# Patient Record
Sex: Female | Born: 2000 | Race: White | Hispanic: No | Marital: Single | Smoking: Never smoker
Health system: Southern US, Community
[De-identification: ages and names within clinical notes are randomized; demographics above are authoritative.]

## PROBLEM LIST (undated history)

## (undated) DIAGNOSIS — G90A Postural orthostatic tachycardia syndrome (POTS): Secondary | ICD-10-CM

## (undated) HISTORY — DX: Postural orthostatic tachycardia syndrome (POTS): G90.A

---
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
## 2022-01-07 ENCOUNTER — Inpatient Hospital Stay: Payer: Commercial Managed Care - PPO

## 2022-01-07 ENCOUNTER — Encounter: Payer: Self-pay | Admitting: Internal Medicine

## 2022-01-07 ENCOUNTER — Ambulatory Visit: Payer: Commercial Managed Care - PPO | Admitting: Internal Medicine

## 2022-01-07 ENCOUNTER — Ambulatory Visit: Payer: Commercial Managed Care - PPO | Admitting: Cardiology

## 2022-01-07 VITALS — Temp 98.3°F | Resp 16 | Ht 64.0 in | Wt 135.0 lb

## 2022-01-07 DIAGNOSIS — G90A Postural orthostatic tachycardia syndrome (POTS): Secondary | ICD-10-CM | POA: Insufficient documentation

## 2022-01-07 DIAGNOSIS — I493 Ventricular premature depolarization: Secondary | ICD-10-CM

## 2022-01-07 DIAGNOSIS — R002 Palpitations: Secondary | ICD-10-CM

## 2022-01-07 NOTE — Progress Notes (Signed)
Primary Physician/Referring:  Imagene Riches, NP  Patient ID: Cathy Lee, female    DOB: 2001/02/07, 21 y.o.   MRN: [id number]  Chief Complaint  Patient presents with  . Referral  . 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 female with past medical history significant for orthostatic hypotension, palpitations, and frequent PVCs who is here to establish care with cardiology.  Her primary care provider believes patient may have pots disease which is consistent with her symptoms.  Patient does have palpitations and she used to go into bigeminy when she was very symptomatic.  She gets lightheaded when changing position and feels like she is going to pass out.  She does not smoke or drink alcohol.  Denies recreational drug use.  She has not had any hormone or vitamin levels checked that could possibly be attributing to this.  She denies any cardiac history in herself or her family prior to this.  Past Medical History:  Diagnosis Date  . Postural orthostatic tachycardia syndrome    History reviewed. No pertinent surgical history. History reviewed. No pertinent family history.  Social History   Tobacco Use  . Smoking status: Never  . Smokeless tobacco: Never  Substance Use Topics  . Alcohol use: Yes    Comment: socially   Marital Status: Single  ROS  Review of Systems  Cardiovascular:  Positive for irregular heartbeat, near-syncope, palpitations and syncope.  All other systems reviewed and are negative. Objective  Temperature 98.3 F (36.8 C), temperature source Temporal, resp. rate 16, height _0  (1.626 m), weight 135 lb (61.2 kg), SpO2 100 %. Body mass index is 23.17 kg/m.     01/07/2022   11:23 AM 07/11/2011   11:27 AM  Vitals with BMI  Height _1    Weight 135 lbs   BMI 57.84   Systolic  696  Diastolic  77  Pulse  80     Physical Exam Vitals reviewed.  Constitutional:      Appearance:  Normal appearance. She is normal weight.  HENT:     Head: Normocephalic and atraumatic.  Neck:     Vascular: No carotid bruit.  Cardiovascular:     Rate and Rhythm: Normal rate and regular rhythm.     Pulses: Normal pulses.     Heart sounds: Normal heart sounds.  Pulmonary:     Effort: Pulmonary effort is normal.     Breath sounds: Normal breath sounds.  Abdominal:     General: Abdomen is flat. Bowel sounds are normal.     Palpations: Abdomen is soft.  Musculoskeletal:     Right lower leg: No edema.     Left lower leg: No edema.  Skin:    General: Skin is warm and dry.  Neurological:     Mental Status: She is alert.   Medications and allergies  No Known Allergies   Medication list after today's encounter   Current Outpatient Medications:  .  Cyanocobalamin (B-12) 3000 MCG CAPS, Take 0.5 capsules by mouth daily., Disp: , Rfl:  .  ibuprofen (ADVIL,MOTRIN) 400 MG tablet, Take 400 mg by mouth every 6 (six) hours as needed., Disp: , Rfl:  .  magnesium oxide (MAG-OX) 400 MG tablet, Take by mouth., Disp: , Rfl:   Laboratory examination:   No results found for: "NA", "K", "CO2", "GLUCOSE", "BUN", "CREATININE", "CALCIUM", "EGFR", "GFRNONAA"  No data to display             No data to display          Lipid Panel No results for input(s): "CHOL", "TRIG", "Pimaco Two", "VLDL", "HDL", "CHOLHDL", "LDLDIRECT" in the last 8760 hours.  HEMOGLOBIN A1C No results found for: "HGBA1C", "MPG" TSH No results for input(s): "TSH" in the last 8760 hours.  External labs:     Radiology:    Cardiac Studies:   No results found for this or any previous visit from the past 1095 days.     No results found for this or any previous visit from the past 1095 days.     EKG:   01/07/22 EKG normal sinus rhythm  Assessment     ICD-10-CM   1. Postural orthostatic tachycardia syndrome  G90.A EKG 12-Lead    [facility] ECHOCARDIOGRAM COMPLETE    LONG TERM MONITOR (3-14 DAYS)    High  sensitivity CRP    Lipid Panel With LDL/HDL Ratio    TSH+T4F+T3Free    B12 and Folate Panel    Vitamin D (25 hydroxy)    TILT TABLE STUDY    2. Palpitations  R00.2     3. 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 High sensitivity CRP  . Lipid Panel With LDL/HDL Ratio  . TSH+T4F+T3Free  . B12 and Folate Panel  . Vitamin D (25 hydroxy)  . LONG TERM MONITOR (3-14 DAYS)    Standing Status:   Future    Number of Occurrences:   1    Standing Expiration Date:   01/08/2023    Order Specific Question:   Where should this test be performed?    Answer:   [facility]    Order Specific Question:   Does the patient have an implanted cardiac device?    Answer:   No    Order Specific Question:   Prescribed days of wear    Answer:   61    Order Specific Question:   Type of enrollment    Answer:   Clinic Enrollment    Order Specific Question:   Release to patient    Answer:   Immediate  . EKG 12-Lead  . PCV ECHOCARDIOGRAM COMPLETE    Standing Status:   Future    Standing Expiration Date:   01/08/2023  . TILT TABLE STUDY    Rule out POTTS    No orders of the defined types were placed in this encounter.   There are no discontinued medications.   Recommendations:   Cathy Lee is a 21 y.o.  F with potential POTS disease  Continue current medications. Check labs, including TSH/vitD/b12 Will arrange tilt table test. Schedule imaging tests in office - echo and event monitor. Follow-up in 1-2 months or sooner if needed.     Cathy Flock, DO  01/07/2022, 2:50 PM Office: [phone] Pager: [phone]

## 2022-01-07 NOTE — Patient Instructions (Signed)
Continue current medications. Check labs, including TSH/vitD/b12 Will arrange tilt table test. Schedule imaging tests in office - echo and event monitor. Follow-up in 1-2 months or sooner if needed.

## 2022-01-08 LAB — LIPID PANEL WITH LDL/HDL RATIO
Cholesterol, Total: 180 mg/dL (ref 100–199)
HDL: 102 mg/dL (ref >39)
LDL Chol Calc (NIH): 68 mg/dL (ref 0–99)
LDL/HDL Ratio: 0.7 ratio (ref 0.0–3.2)
Triglycerides: 51 mg/dL (ref 0–149)
VLDL Cholesterol Cal: 10 mg/dL (ref 5–40)

## 2022-01-08 LAB — TSH+T4F+T3FREE
Free T4: 1.16 ng/dL (ref 0.82–1.77)
T3, Free: 2.4 pg/mL (ref 2.0–4.4)
TSH: 0.796 u[IU]/mL (ref 0.450–4.500)

## 2022-01-08 LAB — HIGH SENSITIVITY CRP: CRP, High Sensitivity: <0.15 mg/L (ref 0.00–3.00)

## 2022-01-08 LAB — B12 AND FOLATE PANEL
Folate: 8.8 ng/mL (ref >3.0)
Vitamin B-12: 424 pg/mL (ref 232–1245)

## 2022-01-08 LAB — VITAMIN D 25 HYDROXY (VIT D DEFICIENCY, FRACTURES): Vit D, 25-Hydroxy: 53.2 ng/mL (ref 30.0–100.0)

## 2022-01-24 ENCOUNTER — Ambulatory Visit: Payer: Commercial Managed Care - PPO | Admitting: Psychiatry

## 2022-02-14 ENCOUNTER — Ambulatory Visit: Payer: Commercial Managed Care - PPO

## 2022-02-14 DIAGNOSIS — G90A Postural orthostatic tachycardia syndrome (POTS): Secondary | ICD-10-CM

## 2022-02-15 NOTE — Progress Notes (Unsigned)
[facility]  PATIENT: Cathy Lee DOB: 01-10-2001  REFERRING CLINICIAN: Rhea Bleacher, NP HISTORY FROM: self REASON FOR VISIT: headaches, dizziness   HISTORICAL  CHIEF COMPLAINT:  Chief Complaint  Patient presents with   New Patient (Initial Visit)    Pr reports feeling good. She states she started noticing it about 34 months ago. She reports she gets the shakes, blotchy vision, dizziness and sweating. Room 1 alone    HISTORY OF PRESENT ILLNESS:  The patient presents for evaluation of lightheadedness which began 3-4 months ago. Episodes are triggered by standing up too fast, working out, and showers. She will feel light headed and will develop palpitations. Then will develop tremors, diaphoresis, and black spots in her vision. She will occasionally see flashing speckles in her vision as well. Episodes are not associated with a headache. Symptoms last 30 seconds to 2 minutes at a time. She is not confused afterwards. They typically occur 2-4 times per week. She had similar symptoms a couple of years ago and lost consciousness at that time, but has not lost consciousness with the most recent episodes.   She is following with Cardiology and currently undergoing workup for POTS. She is working on scheduling a tilt table study.  Was found to have vitamin B12 deficiency by her PCP in July and was started on B12 supplements.  Had an MRI brain done in 2016 for migraines which was normal.  OTHER MEDICAL CONDITIONS: anxiety, migraines   REVIEW OF SYSTEMS: Full 14 system review of systems performed and negative with exception of: lightheadedness  ALLERGIES: No Known Allergies  HOME MEDICATIONS: Outpatient Medications Prior to Visit  Medication Sig Dispense Refill   Cyanocobalamin (B-12) 3000 MCG CAPS Take 0.5 capsules by mouth daily.     ibuprofen (ADVIL,MOTRIN) 400 MG tablet Take 400 mg by mouth every 6 (six) hours as needed.     magnesium oxide (MAG-OX) 400  MG tablet Take by mouth.     No facility-administered medications prior to visit.    PAST MEDICAL HISTORY: Past Medical History:  Diagnosis Date   Postural orthostatic tachycardia syndrome     PAST SURGICAL HISTORY: No past surgical history on file.  FAMILY HISTORY: No family history on file.  SOCIAL HISTORY: Social History   Socioeconomic History   Marital status: Single    Spouse name: Not on file   Number of children: Not on file   Years of education: Not on file   Highest education level: Not on file  Occupational History   Not on file  Tobacco Use   Smoking status: Never   Smokeless tobacco: Never  Vaping Use   Vaping Use: Never used  Substance and Sexual Activity   Alcohol use: Yes    Comment: socially   Drug use: Never   Sexual activity: Never    Birth control/protection: Abstinence  Other Topics Concern   Not on file  Social History Narrative   Not on file   Social Determinants of Health   Financial Resource Strain: Not on file  Food Insecurity: Not on file  Transportation Needs: Not on file  Physical Activity: Not on file  Stress: Not on file  Social Connections: Not on file  Intimate Partner Violence: Not on file     PHYSICAL EXAM  GENERAL EXAM/CONSTITUTIONAL: Vitals:  Vitals:   02/16/22 1245  BP: 119/74  Pulse: 61  Weight: 139 lb 8 oz (63.3 kg)  Height: 5\' 4"  (1.626 m)   Body mass  index is 23.95 kg/m. Wt Readings from Last 3 Encounters:  02/16/22 139 lb 8 oz (63.3 kg)  01/07/22 135 lb (61.2 kg)    NEUROLOGIC: MENTAL STATUS:  awake, alert, oriented to person, place and time recent and remote memory intact normal attention and concentration   CRANIAL NERVE:  2nd, 3rd, 4th, 6th - pupils equal and reactive to light, visual fields full to confrontation, extraocular muscles intact, no nystagmus 5th - facial sensation symmetric 7th - facial strength symmetric 8th - hearing intact 9th - palate elevates symmetrically, uvula  midline 11th - shoulder shrug symmetric 12th - tongue protrusion midline  MOTOR:  normal bulk and tone, full strength in the BUE, BLE  SENSORY:  normal and symmetric to light touch all 4 extremities  COORDINATION:  finger-nose-finger intact bilaterally  REFLEXES:  deep tendon reflexes present and symmetric  GAIT/STATION:  normal     DIAGNOSTIC DATA (LABS, IMAGING, TESTING) - I reviewed patient records, labs, notes, testing and imaging myself where available.  No results found for: "WBC", "HGB", "HCT", "MCV", "PLT" No results found for: "NA", "K", "CL", "CO2", "GLUCOSE", "BUN", "CREATININE", "CALCIUM", "PROT", "ALBUMIN", "AST", "ALT", "ALKPHOS", "BILITOT", "GFRNONAA", "GFRAA" Lab Results  Component Value Date   CHOL 180 01/07/2022   HDL 102 01/07/2022   LDLCALC 68 01/07/2022   TRIG 51 01/07/2022   No results found for: "HGBA1C" Lab Results  Component Value Date   [id number] 299 01/07/2022   Lab Results  Component Value Date   TSH 0.796 01/07/2022     ASSESSMENT AND PLAN  21 y.o. year old female with a history of migraines and anxiety who presents for evaluation of lightheadedness. She is currently undergoing evaluation for POTS by Cardiology. Episodes sound most consistent with presyncope. However given history of loss of consciousness and occasional lights in her vision associated with episodes, will order EEG to rule out underlying seizure activity.   1. Syncope, unspecified syncope type       PLAN: -EEG -Lifestyle measures for management of orthostatic intolerance provided  -Return after testing  Orders Placed This Encounter  Procedures   EEG adult     Genia Harold, MD 02/16/22 1:11 PM  I spent an average of 21 minutes chart reviewing and counseling the patient, with at least 50% of the time face to face with the patient.   [facility] [address] [place], [place] [address] [phone]

## 2022-02-16 ENCOUNTER — Ambulatory Visit (INDEPENDENT_AMBULATORY_CARE_PROVIDER_SITE_OTHER): Payer: Commercial Managed Care - PPO | Admitting: Psychiatry

## 2022-02-16 VITALS — BP 119/74 | HR 61 | Ht 64.0 in | Wt 139.5 lb

## 2022-02-16 DIAGNOSIS — R55 Syncope and collapse: Secondary | ICD-10-CM | POA: Diagnosis not present

## 2022-02-16 NOTE — Patient Instructions (Addendum)
Plan: -EEG to look for seizure activity   Guidelines for the Management of Orthostatic intolerance  1. Make all postural changes from lying to sitting or sitting to standing, slowly.  2. Drink to 2.0 -2.5 L of fluids per day.  3. Increase sodium in the diet to 3 - 5 g per day.  4. Avoid large meals which can cause low blood pressure during digestion. It is better to eat smaller meals more often than three large meals.  5. Avoid alcohol. Alcohol and cause blood to pool in the legs which may worsen low blood pressure reactions when standing.  6. Perform lower extremity exercises to improve strength of the leg muscles. This will help prevent blood from a pooling in the legs when standing and walking.  7. Raise the head of the bed by 6 to 10 inches. The entire bed must be at an angle. Raising only the head portion of the bed at waist level or using pillows will not be effective. Raising the head of the bed will reduce urine formation overnight and there will be more volume in the circulation in the morning.  8. During bad days, drink 500 cc of water quickly. This will result in an increased blood pressure within 5 minutes of drinking the water. The effect will last up to one hour and may improve orthostatic intolerance.  9. Use custom fitted elastic support stockings. These will reduce a tendency for blood to pool in the legs when standing and may improve orthostatic intolerance.  10. Use physical counter maneuvers such as leg crossing, squatting, or raising and resting the leg on a chair. These maneuvers increase blood pressure and can improve orthostatic intolerance.  11. Aerobic exercises

## 2022-02-17 ENCOUNTER — Telehealth: Payer: Self-pay | Admitting: Psychiatry

## 2022-02-17 NOTE — Telephone Encounter (Signed)
LVM and sent MyChart msg informing pt of r/s needed for 9/25 EEG- Claiborne Billings out early.

## 2022-02-21 ENCOUNTER — Other Ambulatory Visit: Payer: Commercial Managed Care - PPO | Admitting: *Deleted

## 2022-02-21 ENCOUNTER — Encounter: Payer: Self-pay | Admitting: Internal Medicine

## 2022-02-21 ENCOUNTER — Ambulatory Visit: Payer: Commercial Managed Care - PPO | Admitting: Internal Medicine

## 2022-02-21 VITALS — BP 132/84 | HR 60 | Temp 98.2°F | Resp 16 | Ht 64.0 in | Wt 135.0 lb

## 2022-02-21 DIAGNOSIS — I951 Orthostatic hypotension: Secondary | ICD-10-CM

## 2022-02-21 DIAGNOSIS — R55 Syncope and collapse: Secondary | ICD-10-CM

## 2022-02-21 DIAGNOSIS — G90A Postural orthostatic tachycardia syndrome (POTS): Secondary | ICD-10-CM

## 2022-02-21 NOTE — Progress Notes (Signed)
Primary Physician/Referring:  Rhea Bleacher, NP  Patient ID: Cathy Lee, female    DOB: 23-Jan-2001, 23 y.o.   MRN: [id number]  No chief complaint on file.  HPI:    YARIXA LIGHTCAP  is a 21 y.o. female with past medical history significant for orthostatic hypotension, palpitations, and frequent PVCs who is here for a follow-up visit.  All of her testing has come back normal. She gets lightheaded when changing position and feels like she is going to pass out.  It appears the patient suffers from orthostatic hypotension. She is agreeable to trying the POTS clinic at [facility] since her heart rate gets very elevated with position changes. Discussed wearing compression stockings, increasing salt and water intake, decreased caffeine intake and avoiding very hot temperatures.  Past Medical History:  Diagnosis Date   Postural orthostatic tachycardia syndrome    History reviewed. No pertinent surgical history. History reviewed. No pertinent family history.  Social History   Tobacco Use   Smoking status: Never   Smokeless tobacco: Never  Substance Use Topics   Alcohol use: Yes    Comment: socially   Marital Status: Single  ROS  Review of Systems  Cardiovascular:  Positive for irregular heartbeat and near-syncope. Negative for palpitations and syncope.  All other systems reviewed and are negative.  Objective  Blood pressure 132/84, pulse 60, temperature 98.2 F (36.8 C), temperature source Temporal, resp. rate 16, height _0  (1.626 m), weight 135 lb (61.2 kg), SpO2 100 %. Body mass index is 23.17 kg/m.     02/21/2022    9:27 AM 02/16/2022   12:45 PM 01/07/2022   11:23 AM  Vitals with BMI  Height _1  _2  _3   Weight 135 lbs 139 lbs 8 oz 135 lbs  BMI 23.16 65.53 74.82  Systolic 707 867   Diastolic 84 74   Pulse 60 61      Physical Exam Vitals reviewed.  Constitutional:      Appearance: Normal appearance. She is normal weight.  HENT:     Head: Normocephalic and  atraumatic.  Neck:     Vascular: No carotid bruit.  Cardiovascular:     Rate and Rhythm: Normal rate and regular rhythm.     Pulses: Normal pulses.     Heart sounds: Normal heart sounds.  Pulmonary:     Effort: Pulmonary effort is normal.     Breath sounds: Normal breath sounds.  Abdominal:     General: Abdomen is flat. Bowel sounds are normal.     Palpations: Abdomen is soft.  Musculoskeletal:     Right lower leg: No edema.     Left lower leg: No edema.  Skin:    General: Skin is warm and dry.  Neurological:     Mental Status: She is alert.   Medications and allergies  Not on File   Medication list after today's encounter   Current Outpatient Medications:    Cyanocobalamin (B-12) 3000 MCG CAPS, Take 0.5 capsules by mouth daily., Disp: , Rfl:    ibuprofen (ADVIL,MOTRIN) 400 MG tablet, Take 400 mg by mouth every 6 (six) hours as needed., Disp: , Rfl:    magnesium oxide (MAG-OX) 400 MG tablet, Take by mouth., Disp: , Rfl:   Laboratory examination:   No results found for: "NA", "K", "CO2", "GLUCOSE", "BUN", "CREATININE", "CALCIUM", "EGFR", "GFRNONAA"      No data to display             No  data to display          Lipid Panel Recent Labs    01/07/22 1258  CHOL 180  TRIG 51  LDLCALC 68  HDL 102    HEMOGLOBIN A1C No results found for: "HGBA1C", "MPG" TSH Recent Labs    01/07/22 1258  TSH 0.796    External labs:     Radiology:    Cardiac Studies:   Echocardiogram 02/14/2022:  Normal LV systolic function with visual EF 55-60%. Left ventricle cavity is normal in size. Normal left ventricular wall thickness. Normal global wall motion. Normal diastolic filling pattern, normal LAP. Calculated EF 57%.  Mild (Grade I) mitral regurgitation. Mild prolapse of the mitral valve leaflets.  Structurally normal tricuspid valve.  Mild tricuspid regurgitation. No evidence of pulmonary hypertension.  Recommend monitoring via repeat annual echo.   Patch Wear  Time:  11 days and 6 hours (2023-08-11T12:20:46-0400 to 2023-08-22T18:48:41-0400) Patient had a min HR of 37 bpm, max HR of 174 bpm, and avg HR of 71 bpm. Predominant underlying rhythm was Sinus Rhythm. Isolated SVEs were rare (<1.0%), and no SVE Couplets or SVE Triplets were present. Isolated VEs were rare (<1.0%), VE Couplets were  rare (<1.0%), and no VE Triplets were present. Ventricular Bigeminy was present.    EKG:   01/07/22 EKG normal sinus rhythm  Assessment     ICD-10-CM   1. Postural orthostatic tachycardia syndrome  G90.A Ambulatory referral to Cardiac Electrophysiology    2. Syncope and collapse  R55 Ambulatory referral to Cardiac Electrophysiology    3. Orthostatic hypotension  I95.1 Ambulatory referral to Cardiac Electrophysiology       Orders Placed This Encounter  Procedures   Ambulatory referral to Cardiac Electrophysiology    Referral Priority:   Routine    Referral Type:   Consultation    Referral Reason:   Specialty Services Required    Referred to Provider:   Rip Harbour, Shirl Harris, MD    Requested Specialty:   Cardiology    Number of Visits Requested:   1    No orders of the defined types were placed in this encounter.   There are no discontinued medications.   Recommendations:   DELCIE Lee is a 21 y.o.  F with orthostatic hypotension  Discussed wearing compression stockings, increasing salt and water intake, decreased caffeine intake and avoiding very hot temperatures.  Continue current medications. Discussed lab results - TSH/vitD/b12 Will refer to POTS clinic at [facility] Event monitor and echo discussed with patient. Repeat echo in 1 year due to MVP and mild MR. Follow-up in 12 months or sooner if needed.     [name], DO  02/21/2022, 11:29 AM Office: [phone] Pager: [phone]

## 2022-02-21 NOTE — Progress Notes (Signed)
V

## 2022-02-22 ENCOUNTER — Ambulatory Visit (INDEPENDENT_AMBULATORY_CARE_PROVIDER_SITE_OTHER): Payer: Commercial Managed Care - PPO | Admitting: Neurology

## 2022-02-22 DIAGNOSIS — R55 Syncope and collapse: Secondary | ICD-10-CM | POA: Diagnosis not present

## 2022-02-28 NOTE — Procedures (Signed)
    History:  21 year old woman with syncope  EEG classification: Awake and drowsy  Description of the recording: The background rhythms of this recording consists of a fairly well modulated medium amplitude alpha rhythm of 12 Hz that is reactive to eye opening and closure. As the record progresses, the patient appears to remain in the waking state throughout the recording. Photic stimulation was performed, did not show any abnormalities. Hyperventilation was also performed, did not show any abnormalities. Toward the end of the recording, the patient enters the drowsy state with slight symmetric slowing seen. The patient never enters stage II sleep. No abnormal epileptiform discharges seen during this recording. There was no focal slowing. EKG monitor shows no evidence of cardiac rhythm abnormalities with a heart rate of 60.  Abnormality: None   Impression: This is a normal EEG recording in the waking and drowsy state. No evidence of interictal epileptiform discharges seen. A normal EEG does not exclude a diagnosis of epilepsy.    Alric Ran, MD [facility]

## 2022-03-09 ENCOUNTER — Encounter: Payer: Self-pay | Admitting: Internal Medicine

## 2022-03-09 NOTE — Telephone Encounter (Signed)
From patient.

## 2022-03-09 NOTE — Telephone Encounter (Signed)
Called and lvm for patient to return call to discuss more

## 2022-03-09 NOTE — Telephone Encounter (Signed)
I ordered the referral

## 2023-02-22 ENCOUNTER — Ambulatory Visit: Payer: Commercial Managed Care - PPO | Admitting: Internal Medicine

## 2023-02-23 ENCOUNTER — Ambulatory Visit: Payer: Commercial Managed Care - PPO | Admitting: Cardiology
